# Patient Record
Sex: Female | Born: 1975 | Race: Black or African American | Hispanic: No | Marital: Married | Smoking: Never smoker
Health system: Southern US, Community
[De-identification: ages and names within clinical notes are randomized; demographics above are authoritative.]

## PROBLEM LIST (undated history)

## (undated) DIAGNOSIS — O139 Gestational [pregnancy-induced] hypertension without significant proteinuria, unspecified trimester: Secondary | ICD-10-CM

## (undated) DIAGNOSIS — Z8619 Personal history of other infectious and parasitic diseases: Secondary | ICD-10-CM

## (undated) DIAGNOSIS — D219 Benign neoplasm of connective and other soft tissue, unspecified: Secondary | ICD-10-CM

## (undated) DIAGNOSIS — O24419 Gestational diabetes mellitus in pregnancy, unspecified control: Secondary | ICD-10-CM

## (undated) HISTORY — PX: DILATION AND EVACUATION: SHX1459

## (undated) HISTORY — DX: Personal history of other infectious and parasitic diseases: Z86.19

## (undated) HISTORY — PX: DILATION AND CURETTAGE OF UTERUS: SHX78

---

## 2004-10-15 ENCOUNTER — Emergency Department (HOSPITAL_COMMUNITY): Admission: EM | Admit: 2004-10-15 | Discharge: 2004-10-15 | Payer: Self-pay | Admitting: Emergency Medicine

## 2004-10-17 ENCOUNTER — Inpatient Hospital Stay (HOSPITAL_COMMUNITY): Admission: AD | Admit: 2004-10-17 | Discharge: 2004-10-17 | Payer: Self-pay | Admitting: *Deleted

## 2004-10-23 ENCOUNTER — Inpatient Hospital Stay (HOSPITAL_COMMUNITY): Admission: AD | Admit: 2004-10-23 | Discharge: 2004-10-23 | Payer: Self-pay | Admitting: Obstetrics

## 2005-03-14 ENCOUNTER — Inpatient Hospital Stay (HOSPITAL_COMMUNITY): Admission: AD | Admit: 2005-03-14 | Discharge: 2005-03-14 | Payer: Self-pay | Admitting: Obstetrics

## 2005-03-15 ENCOUNTER — Inpatient Hospital Stay (HOSPITAL_COMMUNITY): Admission: AD | Admit: 2005-03-15 | Discharge: 2005-03-15 | Payer: Self-pay | Admitting: Obstetrics

## 2005-04-19 ENCOUNTER — Inpatient Hospital Stay (HOSPITAL_COMMUNITY): Admission: AD | Admit: 2005-04-19 | Discharge: 2005-04-22 | Payer: Self-pay | Admitting: Obstetrics & Gynecology

## 2005-07-31 ENCOUNTER — Ambulatory Visit (HOSPITAL_COMMUNITY): Admission: RE | Admit: 2005-07-31 | Discharge: 2005-07-31 | Payer: Self-pay | Admitting: Obstetrics & Gynecology

## 2006-01-16 ENCOUNTER — Emergency Department (HOSPITAL_COMMUNITY): Admission: EM | Admit: 2006-01-16 | Discharge: 2006-01-16 | Payer: Self-pay | Admitting: Family Medicine

## 2006-01-20 ENCOUNTER — Emergency Department (HOSPITAL_COMMUNITY): Admission: EM | Admit: 2006-01-20 | Discharge: 2006-01-20 | Payer: Self-pay | Admitting: Emergency Medicine

## 2008-05-26 ENCOUNTER — Emergency Department (HOSPITAL_COMMUNITY): Admission: EM | Admit: 2008-05-26 | Discharge: 2008-05-26 | Payer: Self-pay | Admitting: Emergency Medicine

## 2009-01-18 ENCOUNTER — Inpatient Hospital Stay (HOSPITAL_COMMUNITY): Admission: AD | Admit: 2009-01-18 | Discharge: 2009-01-18 | Payer: Self-pay | Admitting: Obstetrics & Gynecology

## 2010-03-15 ENCOUNTER — Ambulatory Visit (HOSPITAL_COMMUNITY): Admission: RE | Admit: 2010-03-15 | Discharge: 2010-03-15 | Payer: Self-pay | Admitting: Obstetrics and Gynecology

## 2010-10-29 ENCOUNTER — Inpatient Hospital Stay (HOSPITAL_COMMUNITY)
Admission: AD | Admit: 2010-10-29 | Discharge: 2010-10-29 | Payer: Self-pay | Source: Home / Self Care | Attending: Obstetrics & Gynecology | Admitting: Obstetrics & Gynecology

## 2010-11-18 NOTE — L&D Delivery Note (Signed)
Delivery Note At 12:48 AM a viable female was delivered via Vaginal, Spontaneous Delivery (Presentation: Left Occiput Anterior).  APGAR: 8, 9; weight 6 lb 2.9 oz (2805 g).   Placenta status: Intact, Spontaneous.  Cord: 3 vessels with the following complications: None.   Anesthesia: Local  Episiotomy:  Lacerations: 2nd degree Suture Repair: 3.0 vicryl Est. Blood Loss (mL): 350  Mom to postpartum.  Baby to nursery-stable.  Yakub Lodes H. 06/25/2011, 2:06 AM

## 2010-12-09 ENCOUNTER — Encounter: Payer: Self-pay | Admitting: Obstetrics & Gynecology

## 2011-01-28 LAB — URINALYSIS, ROUTINE W REFLEX MICROSCOPIC
Bilirubin Urine: NEGATIVE
Glucose, UA: NEGATIVE mg/dL
Hgb urine dipstick: NEGATIVE
Ketones, ur: NEGATIVE mg/dL
Nitrite: NEGATIVE
Protein, ur: NEGATIVE mg/dL
Specific Gravity, Urine: 1.02 (ref 1.005–1.030)
Urobilinogen, UA: 0.2 mg/dL (ref 0.0–1.0)
pH: 5.5 (ref 5.0–8.0)

## 2011-01-28 LAB — POCT PREGNANCY, URINE: Preg Test, Ur: POSITIVE

## 2011-05-01 ENCOUNTER — Encounter: Payer: 59 | Attending: Obstetrics and Gynecology

## 2011-05-01 DIAGNOSIS — O99810 Abnormal glucose complicating pregnancy: Secondary | ICD-10-CM | POA: Insufficient documentation

## 2011-05-01 DIAGNOSIS — Z713 Dietary counseling and surveillance: Secondary | ICD-10-CM | POA: Insufficient documentation

## 2011-06-25 ENCOUNTER — Inpatient Hospital Stay (HOSPITAL_COMMUNITY)
Admission: AD | Admit: 2011-06-25 | Discharge: 2011-06-26 | DRG: 774 | Disposition: A | Discharge status: Home / Self Care | Payer: 59 | Source: Ambulatory Visit | Attending: Obstetrics and Gynecology | Admitting: Obstetrics and Gynecology

## 2011-06-25 ENCOUNTER — Encounter (HOSPITAL_COMMUNITY): Payer: Self-pay | Admitting: *Deleted

## 2011-06-25 DIAGNOSIS — E119 Type 2 diabetes mellitus without complications: Secondary | ICD-10-CM | POA: Diagnosis present

## 2011-06-25 DIAGNOSIS — O2432 Unspecified pre-existing diabetes mellitus in childbirth: Secondary | ICD-10-CM | POA: Diagnosis present

## 2011-06-25 HISTORY — DX: Gestational (pregnancy-induced) hypertension without significant proteinuria, unspecified trimester: O13.9

## 2011-06-25 HISTORY — DX: Gestational diabetes mellitus in pregnancy, unspecified control: O24.419

## 2011-06-25 HISTORY — DX: Benign neoplasm of connective and other soft tissue, unspecified: D21.9

## 2011-06-25 LAB — ABO/RH: RH Type: POSITIVE

## 2011-06-25 LAB — RPR: RPR: NONREACTIVE

## 2011-06-25 LAB — HIV ANTIBODY (ROUTINE TESTING W REFLEX): HIV: NONREACTIVE

## 2011-06-25 LAB — CBC
HCT: 33.4 % — ABNORMAL LOW (ref 36.0–46.0)
Hemoglobin: 10.9 g/dL — ABNORMAL LOW (ref 12.0–15.0)
MCHC: 32.6 g/dL (ref 30.0–36.0)
RBC: 4.51 MIL/uL (ref 3.87–5.11)

## 2011-06-25 LAB — RUBELLA ANTIBODY, IGM: Rubella: IMMUNE

## 2011-06-25 LAB — TYPE AND SCREEN: Antibody Screen: NEGATIVE

## 2011-06-25 MED ORDER — SENNOSIDES-DOCUSATE SODIUM 8.6-50 MG PO TABS
2.0000 | ORAL_TABLET | Freq: Every day | ORAL | Status: DC
  Administered 2011-06-25: 1 via ORAL

## 2011-06-25 MED ORDER — OXYCODONE-ACETAMINOPHEN 5-325 MG PO TABS: 2.0000 | ORAL_TABLET | ORAL | Status: DC | PRN

## 2011-06-25 MED ORDER — DIBUCAINE 1 % RE OINT: 1.0000 "application " | TOPICAL_OINTMENT | RECTAL | Status: DC | PRN

## 2011-06-25 MED ORDER — DIPHENHYDRAMINE HCL 25 MG PO CAPS: 25.0000 mg | ORAL_CAPSULE | Freq: Four times a day (QID) | ORAL | Status: DC | PRN

## 2011-06-25 MED ORDER — CITRIC ACID-SODIUM CITRATE 334-500 MG/5ML PO SOLN: 30.0000 mL | ORAL | Status: DC | PRN

## 2011-06-25 MED ORDER — PRENATAL PLUS 27-1 MG PO TABS
1.0000 | ORAL_TABLET | Freq: Every day | ORAL | Status: DC
  Administered 2011-06-25 – 2011-06-26 (×2): 1 via ORAL
  Filled 2011-06-25: qty 1

## 2011-06-25 MED ORDER — BENZOCAINE-MENTHOL 20-0.5 % EX AERO: 1.0000 "application " | INHALATION_SPRAY | CUTANEOUS | Status: DC | PRN

## 2011-06-25 MED ORDER — SIMETHICONE 80 MG PO CHEW: 80.0000 mg | CHEWABLE_TABLET | ORAL | Status: DC | PRN

## 2011-06-25 MED ORDER — LIDOCAINE HCL (PF) 1 % IJ SOLN
30.0000 mL | INTRAMUSCULAR | Status: DC | PRN
  Filled 2011-06-25: qty 30

## 2011-06-25 MED ORDER — ZOLPIDEM TARTRATE 5 MG PO TABS: 5.0000 mg | ORAL_TABLET | Freq: Every evening | ORAL | Status: DC | PRN

## 2011-06-25 MED ORDER — ONDANSETRON HCL 4 MG/2ML IJ SOLN: 4.0000 mg | INTRAMUSCULAR | Status: DC | PRN

## 2011-06-25 MED ORDER — NALBUPHINE SYRINGE 5 MG/0.5 ML
10.0000 mg | INJECTION | INTRAMUSCULAR | Status: DC | PRN
  Filled 2011-06-25: qty 1

## 2011-06-25 MED ORDER — IBUPROFEN 600 MG PO TABS
600.0000 mg | ORAL_TABLET | Freq: Four times a day (QID) | ORAL | Status: DC
  Administered 2011-06-25 – 2011-06-26 (×6): 600 mg via ORAL
  Filled 2011-06-25 (×6): qty 1

## 2011-06-25 MED ORDER — ONDANSETRON HCL 4 MG/2ML IJ SOLN: 4.0000 mg | Freq: Four times a day (QID) | INTRAMUSCULAR | Status: DC | PRN

## 2011-06-25 MED ORDER — OXYTOCIN 10 UNIT/ML IJ SOLN
INTRAMUSCULAR | Status: AC
  Administered 2011-06-25: 20 [IU]
  Filled 2011-06-25: qty 2

## 2011-06-25 MED ORDER — TETANUS-DIPHTH-ACELL PERTUSSIS 5-2.5-18.5 LF-MCG/0.5 IM SUSP
0.5000 mL | Freq: Once | INTRAMUSCULAR | Status: DC
  Filled 2011-06-25: qty 0.5

## 2011-06-25 MED ORDER — FLEET ENEMA 7-19 GM/118ML RE ENEM: 1.0000 | ENEMA | RECTAL | Status: DC | PRN

## 2011-06-25 MED ORDER — ONDANSETRON HCL 4 MG PO TABS: 4.0000 mg | ORAL_TABLET | ORAL | Status: DC | PRN

## 2011-06-25 MED ORDER — ACETAMINOPHEN 325 MG PO TABS: 650.0000 mg | ORAL_TABLET | ORAL | Status: DC | PRN

## 2011-06-25 MED ORDER — WITCH HAZEL-GLYCERIN EX PADS: 1.0000 "application " | MEDICATED_PAD | CUTANEOUS | Status: DC | PRN

## 2011-06-25 MED ORDER — IBUPROFEN 600 MG PO TABS: 600.0000 mg | ORAL_TABLET | Freq: Four times a day (QID) | ORAL | Status: DC | PRN

## 2011-06-25 MED ORDER — LIDOCAINE HCL (PF) 1 % IJ SOLN
INTRAMUSCULAR | Status: AC
  Administered 2011-06-25: 01:00:00 via VAGINAL
  Filled 2011-06-25: qty 30

## 2011-06-25 MED ORDER — LACTATED RINGERS IV SOLN: INTRAVENOUS | Status: DC

## 2011-06-25 MED ORDER — LANOLIN HYDROUS EX OINT: TOPICAL_OINTMENT | CUTANEOUS | Status: DC | PRN

## 2011-06-25 MED ORDER — OXYCODONE-ACETAMINOPHEN 5-325 MG PO TABS
1.0000 | ORAL_TABLET | ORAL | Status: DC | PRN
  Administered 2011-06-25 (×2): 1 via ORAL
  Filled 2011-06-25 (×2): qty 1

## 2011-06-25 NOTE — ED Notes (Signed)
Pt arrived in MAU very uncomfortable.  Directly to room 7.  SVE 8/100/0, FHR 140's.  Pt to room 164 via stretcher.

## 2011-06-25 NOTE — Progress Notes (Signed)
PPD#0 Pt without c/o. Lochia-wnl. IMP/stable PLAN/ routine care.

## 2011-06-25 NOTE — H&P (Signed)
  35 yo G3P1011 presents to MAU with painful contractions and was found to be 8 cm dilated. Pt was quickly transferred to L&D. Patients prenatal course has been complicated by A1DM with excellent glucose control. Please see hollister for prenatal course.  Physical Exam Filed Vitals:   06/25/11 0146  BP: 137/63  Pulse: 81  Temp:   Resp: 18   Gen: AOx3, NAD Abd: Gravid, soft NT Cvx: C/C/0 FHT: 130s Toco: q2-3  A/P 35 yo G3P1011 @ 39+3 in active labor 1) Admit 2) AROM 3) Anticipate svd

## 2011-06-25 NOTE — Progress Notes (Signed)
Breastfeeding brochure reviewed with mom. Advised of community resources for breastfeeding mothers and outpatient services available. Mom has been breast and bottle feeding. Advised to do breast only unless supplementing is medically necessary. Discussed nipple confusion, supply and demand. If mom continues to supplement If mom continues to supplement use medicine dropper and limit supplements to 5-7 ml for now. Demonstrated hand expression and given a manual pump. Advised mom she could use colostrum as supplement is she feels this is necessary.

## 2011-06-26 ENCOUNTER — Encounter (HOSPITAL_COMMUNITY): Payer: 59

## 2011-06-26 NOTE — Progress Notes (Signed)
Post Partum Day 1 Subjective: no complaints  Objective: Blood pressure 119/80, pulse 78, temperature 98.4 F (36.9 C), temperature source Oral, resp. rate 18, height 5\' 4"  (1.626 m), weight 85.276 kg (188 lb), unknown if currently breastfeeding.  Physical Exam:  General: alert Lochia: appropriate Uterine Fundus: firm   Basename 06/25/11 0245  HGB 10.9*  HCT 33.4*    Assessment/Plan: Plan for discharge later today if baby can go   LOS: 1 day   Armonie Staten D 06/26/2011, 11:05 AM

## 2011-06-26 NOTE — Discharge Summary (Signed)
  Obstetric Discharge Summary Reason for Admission: onset of labor Prenatal Procedures: ultrasound Intrapartum Procedures: spontaneous vaginal delivery Postpartum Procedures: none Complications-Operative and Postpartum: 2nd degree perineal laceration Hemoglobin  Date Value Range Status  06/25/2011 10.9* 12.0-15.0 (g/dL) Final     HCT  Date Value Range Status  06/25/2011 33.4* 36.0-46.0 (%) Final    Discharge Diagnoses: Term Pregnancy-delivered  Discharge Information: Date: 06/26/2011 Activity: pelvic rest Diet: routine Medications: PNV and Ibuprophen Condition: stable Instructions: refer to practice specific booklet Discharge to: home Follow-up Information    Follow up with ROSS,KENDRA H. in 5 weeks.   Contact information:   [address] [place] [address] [phone]          Newborn Data: Live born female  Birth Weight: 6 lb 2.9 oz (2805 g) APGAR: 8, 9  Home with mother.  Ceasar Decandia D 06/26/2011, 12:28 PM

## 2011-07-05 NOTE — Progress Notes (Signed)
Encounter addended by: Caffie Pinto, RN on: 07/05/2011  4:59 PM<BR>     Documentation filed: Sheral Apley and Lactation Status

## 2012-09-11 LAB — OB RESULTS CONSOLE HIV ANTIBODY (ROUTINE TESTING): HIV: NONREACTIVE

## 2012-11-18 NOTE — L&D Delivery Note (Addendum)
Delivery Note At 1:44 PM a viable and healthy female was delivered via  (Presentation: OP manually rotated to LOA ).  APGAR: 9, 9; weight 7 lbs 13 oz.   Placenta status: , .  Cord:  3 vessels.  Anesthesia: Epidural  Lacerations: 1st degree perineal laceration Suture Repair: 3.0 chromic Est. Blood Loss (mL): 300  Mom to postpartum.  Baby to nursery-stable.  Darnisha Vernet D 04/20/2013, 2:11 PM

## 2013-01-07 ENCOUNTER — Other Ambulatory Visit: Payer: Self-pay | Admitting: Obstetrics and Gynecology

## 2013-01-12 ENCOUNTER — Other Ambulatory Visit: Payer: Self-pay | Admitting: Obstetrics and Gynecology

## 2013-01-12 ENCOUNTER — Ambulatory Visit
Admission: RE | Admit: 2013-01-12 | Discharge: 2013-01-12 | Disposition: A | Discharge status: Home / Self Care | Payer: 59 | Source: Ambulatory Visit | Attending: Obstetrics and Gynecology | Admitting: Obstetrics and Gynecology

## 2013-01-12 DIAGNOSIS — R2231 Localized swelling, mass and lump, right upper limb: Secondary | ICD-10-CM

## 2013-02-17 ENCOUNTER — Encounter: Payer: 59 | Attending: Obstetrics & Gynecology | Admitting: *Deleted

## 2013-02-17 ENCOUNTER — Encounter: Payer: Self-pay | Admitting: *Deleted

## 2013-02-17 DIAGNOSIS — Z713 Dietary counseling and surveillance: Secondary | ICD-10-CM | POA: Insufficient documentation

## 2013-02-17 DIAGNOSIS — O99810 Abnormal glucose complicating pregnancy: Secondary | ICD-10-CM | POA: Insufficient documentation

## 2013-02-17 NOTE — Progress Notes (Signed)
  Patient was seen on 02/17/2013 for Gestational Diabetes self-management class at the [facility]. The following learning objectives were met by the patient during this course:   States the definition of Gestational Diabetes  States why dietary management is important in controlling blood glucose  Describes the effects each nutrient has on blood glucose levels  Demonstrates ability to create a balanced meal plan  Demonstrates carbohydrate counting   States when to check blood glucose levels  Demonstrates proper blood glucose monitoring techniques  States the effect of stress and exercise on blood glucose levels  States the importance of limiting caffeine and abstaining from alcohol and smoking  Blood glucose monitor given: Accu Chek Nano BG Monitoring Kit Lot # [id number] Exp: 03/17/2014 Blood glucose reading: 63 mg/dl  Patient instructed to monitor glucose levels: FBS: 60 - <90 2 hour: <120  *Patient received handouts:  Nutrition Diabetes and Pregnancy  Carbohydrate Counting List  Patient will be seen for follow-up as needed.

## 2013-04-20 ENCOUNTER — Encounter (HOSPITAL_COMMUNITY): Payer: Self-pay | Admitting: *Deleted

## 2013-04-20 ENCOUNTER — Encounter (HOSPITAL_COMMUNITY): Payer: Self-pay | Admitting: Anesthesiology

## 2013-04-20 ENCOUNTER — Inpatient Hospital Stay (HOSPITAL_COMMUNITY): Payer: 59 | Admitting: Anesthesiology

## 2013-04-20 ENCOUNTER — Inpatient Hospital Stay (HOSPITAL_COMMUNITY)
Admission: AD | Admit: 2013-04-20 | Discharge: 2013-04-21 | DRG: 775 | Disposition: A | Discharge status: Home / Self Care | Payer: 59 | Source: Ambulatory Visit | Attending: Obstetrics and Gynecology | Admitting: Obstetrics and Gynecology

## 2013-04-20 DIAGNOSIS — O09529 Supervision of elderly multigravida, unspecified trimester: Secondary | ICD-10-CM | POA: Diagnosis present

## 2013-04-20 DIAGNOSIS — O99814 Abnormal glucose complicating childbirth: Principal | ICD-10-CM | POA: Diagnosis present

## 2013-04-20 LAB — CBC
MCH: 22.3 pg — ABNORMAL LOW (ref 26.0–34.0)
MCHC: 31.6 g/dL (ref 30.0–36.0)
Platelets: 147 10*3/uL — ABNORMAL LOW (ref 150–400)
RDW: 19.2 % — ABNORMAL HIGH (ref 11.5–15.5)

## 2013-04-20 LAB — RPR: RPR Ser Ql: NONREACTIVE

## 2013-04-20 LAB — TYPE AND SCREEN: Antibody Screen: NEGATIVE

## 2013-04-20 MED ORDER — OXYTOCIN 40 UNITS IN LACTATED RINGERS INFUSION - SIMPLE MED
1.0000 m[IU]/min | INTRAVENOUS | Status: DC
  Administered 2013-04-20: 2 m[IU]/min via INTRAVENOUS

## 2013-04-20 MED ORDER — OXYCODONE-ACETAMINOPHEN 5-325 MG PO TABS
1.0000 | ORAL_TABLET | ORAL | Status: DC | PRN
  Administered 2013-04-20 (×2): 1 via ORAL
  Filled 2013-04-20 (×2): qty 1

## 2013-04-20 MED ORDER — CITRIC ACID-SODIUM CITRATE 334-500 MG/5ML PO SOLN: 30.0000 mL | ORAL | Status: DC | PRN

## 2013-04-20 MED ORDER — ACETAMINOPHEN 325 MG PO TABS: 650.0000 mg | ORAL_TABLET | ORAL | Status: DC | PRN

## 2013-04-20 MED ORDER — OXYTOCIN 40 UNITS IN LACTATED RINGERS INFUSION - SIMPLE MED
62.5000 mL/h | INTRAVENOUS | Status: DC
  Filled 2013-04-20: qty 1000

## 2013-04-20 MED ORDER — IBUPROFEN 600 MG PO TABS
600.0000 mg | ORAL_TABLET | Freq: Four times a day (QID) | ORAL | Status: DC
  Administered 2013-04-20 – 2013-04-21 (×4): 600 mg via ORAL
  Filled 2013-04-20 (×5): qty 1

## 2013-04-20 MED ORDER — LACTATED RINGERS IV SOLN
500.0000 mL | INTRAVENOUS | Status: DC | PRN
Start: 2013-04-20 — End: 2013-04-20
  Administered 2013-04-20: 500 mL via INTRAVENOUS

## 2013-04-20 MED ORDER — FLEET ENEMA 7-19 GM/118ML RE ENEM: 1.0000 | ENEMA | RECTAL | Status: DC | PRN

## 2013-04-20 MED ORDER — TERBUTALINE SULFATE 1 MG/ML IJ SOLN: 0.2500 mg | Freq: Once | INTRAMUSCULAR | Status: DC | PRN

## 2013-04-20 MED ORDER — PRENATAL MULTIVITAMIN CH
1.0000 | ORAL_TABLET | Freq: Every day | ORAL | Status: DC
  Administered 2013-04-21: 1 via ORAL
  Filled 2013-04-20: qty 1

## 2013-04-20 MED ORDER — DIBUCAINE 1 % RE OINT: 1.0000 "application " | TOPICAL_OINTMENT | RECTAL | Status: DC | PRN

## 2013-04-20 MED ORDER — IBUPROFEN 600 MG PO TABS: 600.0000 mg | ORAL_TABLET | Freq: Four times a day (QID) | ORAL | Status: DC | PRN

## 2013-04-20 MED ORDER — SIMETHICONE 80 MG PO CHEW: 80.0000 mg | CHEWABLE_TABLET | ORAL | Status: DC | PRN

## 2013-04-20 MED ORDER — BUTORPHANOL TARTRATE 1 MG/ML IJ SOLN: 1.0000 mg | INTRAMUSCULAR | Status: DC | PRN

## 2013-04-20 MED ORDER — LACTATED RINGERS IV SOLN: 500.0000 mL | Freq: Once | INTRAVENOUS | Status: DC

## 2013-04-20 MED ORDER — LANOLIN HYDROUS EX OINT: TOPICAL_OINTMENT | CUTANEOUS | Status: DC | PRN

## 2013-04-20 MED ORDER — EPHEDRINE 5 MG/ML INJ
10.0000 mg | INTRAVENOUS | Status: DC | PRN
  Filled 2013-04-20: qty 2

## 2013-04-20 MED ORDER — TETANUS-DIPHTH-ACELL PERTUSSIS 5-2.5-18.5 LF-MCG/0.5 IM SUSP: 0.5000 mL | Freq: Once | INTRAMUSCULAR | Status: DC

## 2013-04-20 MED ORDER — ONDANSETRON HCL 4 MG/2ML IJ SOLN: 4.0000 mg | INTRAMUSCULAR | Status: DC | PRN

## 2013-04-20 MED ORDER — OXYCODONE-ACETAMINOPHEN 5-325 MG PO TABS: 1.0000 | ORAL_TABLET | ORAL | Status: DC | PRN

## 2013-04-20 MED ORDER — BENZOCAINE-MENTHOL 20-0.5 % EX AERO
1.0000 "application " | INHALATION_SPRAY | CUTANEOUS | Status: DC | PRN
  Administered 2013-04-21: 1 via TOPICAL
  Filled 2013-04-20 (×2): qty 56

## 2013-04-20 MED ORDER — PHENYLEPHRINE 40 MCG/ML (10ML) SYRINGE FOR IV PUSH (FOR BLOOD PRESSURE SUPPORT)
80.0000 ug | PREFILLED_SYRINGE | INTRAVENOUS | Status: DC | PRN
  Filled 2013-04-20: qty 2

## 2013-04-20 MED ORDER — ONDANSETRON HCL 4 MG/2ML IJ SOLN: 4.0000 mg | Freq: Four times a day (QID) | INTRAMUSCULAR | Status: DC | PRN

## 2013-04-20 MED ORDER — FENTANYL 2.5 MCG/ML BUPIVACAINE 1/10 % EPIDURAL INFUSION (WH - ANES)
14.0000 mL/h | INTRAMUSCULAR | Status: DC | PRN
  Administered 2013-04-20: 14 mL/h via EPIDURAL
  Filled 2013-04-20: qty 125

## 2013-04-20 MED ORDER — DIPHENHYDRAMINE HCL 50 MG/ML IJ SOLN: 12.5000 mg | INTRAMUSCULAR | Status: DC | PRN

## 2013-04-20 MED ORDER — OXYTOCIN BOLUS FROM INFUSION
500.0000 mL | INTRAVENOUS | Status: DC
  Administered 2013-04-20: 500 mL via INTRAVENOUS

## 2013-04-20 MED ORDER — LACTATED RINGERS IV SOLN
INTRAVENOUS | Status: DC
  Administered 2013-04-20: 09:00:00 via INTRAVENOUS
  Administered 2013-04-20: 125 mL/h via INTRAVENOUS

## 2013-04-20 MED ORDER — WITCH HAZEL-GLYCERIN EX PADS: 1.0000 "application " | MEDICATED_PAD | CUTANEOUS | Status: DC | PRN

## 2013-04-20 MED ORDER — PHENYLEPHRINE 40 MCG/ML (10ML) SYRINGE FOR IV PUSH (FOR BLOOD PRESSURE SUPPORT)
80.0000 ug | PREFILLED_SYRINGE | INTRAVENOUS | Status: DC | PRN
  Filled 2013-04-20: qty 2
  Filled 2013-04-20: qty 5

## 2013-04-20 MED ORDER — ONDANSETRON HCL 4 MG PO TABS: 4.0000 mg | ORAL_TABLET | ORAL | Status: DC | PRN

## 2013-04-20 MED ORDER — LIDOCAINE HCL (PF) 1 % IJ SOLN
INTRAMUSCULAR | Status: DC | PRN
  Administered 2013-04-20 (×2): 5 mL

## 2013-04-20 MED ORDER — ZOLPIDEM TARTRATE 5 MG PO TABS: 5.0000 mg | ORAL_TABLET | Freq: Every evening | ORAL | Status: DC | PRN

## 2013-04-20 MED ORDER — SENNOSIDES-DOCUSATE SODIUM 8.6-50 MG PO TABS
2.0000 | ORAL_TABLET | Freq: Every day | ORAL | Status: DC
  Administered 2013-04-20: 2 via ORAL

## 2013-04-20 MED ORDER — LIDOCAINE HCL (PF) 1 % IJ SOLN
30.0000 mL | INTRAMUSCULAR | Status: DC | PRN
  Filled 2013-04-20: qty 30

## 2013-04-20 MED ORDER — DIPHENHYDRAMINE HCL 25 MG PO CAPS: 25.0000 mg | ORAL_CAPSULE | Freq: Four times a day (QID) | ORAL | Status: DC | PRN

## 2013-04-20 MED ORDER — EPHEDRINE 5 MG/ML INJ
10.0000 mg | INTRAVENOUS | Status: DC | PRN
  Filled 2013-04-20: qty 2
  Filled 2013-04-20: qty 4

## 2013-04-20 NOTE — Progress Notes (Signed)
AROM clear

## 2013-04-20 NOTE — Progress Notes (Signed)
Dr. Arlyce Dice in room.  Performed vaginal exam.  Pt. Tolerated well.

## 2013-04-20 NOTE — Anesthesia Preprocedure Evaluation (Signed)
Anesthesia Evaluation  Patient identified by MRN, date of birth, ID band Patient awake    Reviewed: Allergy & Precautions, H&P , Patient's Chart, lab work & pertinent test results  Airway Mallampati: III TM Distance: >3 FB Neck ROM: full    Dental no notable dental hx.    Pulmonary neg pulmonary ROS,  breath sounds clear to auscultation  Pulmonary exam normal       Cardiovascular negative cardio ROS  Rhythm:regular Rate:Normal     Neuro/Psych negative neurological ROS  negative psych ROS   GI/Hepatic negative GI ROS, Neg liver ROS,   Endo/Other  negative endocrine ROSdiabetesMorbid obesity  Renal/GU negative Renal ROS     Musculoskeletal   Abdominal   Peds  Hematology negative hematology ROS (+)   Anesthesia Other Findings Fibroid     Pregnancy induced hypertension   with first pregnancy    Gestational diabetes   with second and third child      Reproductive/Obstetrics (+) Pregnancy                           Anesthesia Physical Anesthesia Plan  ASA: III  Anesthesia Plan: Epidural   Post-op Pain Management:    Induction:   Airway Management Planned:   Additional Equipment:   Intra-op Plan:   Post-operative Plan:   Informed Consent: I have reviewed the patients History and Physical, chart, labs and discussed the procedure including the risks, benefits and alternatives for the proposed anesthesia with the patient or authorized representative who has indicated his/her understanding and acceptance.     Plan Discussed with:   Anesthesia Plan Comments:         Anesthesia Quick Evaluation

## 2013-04-20 NOTE — Anesthesia Procedure Notes (Signed)
Epidural Patient location during procedure: OB Start time: 04/20/2013 11:44 AM  Staffing Anesthesiologist: Angus Seller., Harrell Gave. Performed by: anesthesiologist   Preanesthetic Checklist Completed: patient identified, site marked, surgical consent, pre-op evaluation, timeout performed, IV checked, risks and benefits discussed and monitors and equipment checked  Epidural Patient position: sitting Prep: site prepped and draped and DuraPrep Patient monitoring: continuous pulse ox and blood pressure Approach: midline Injection technique: LOR air and LOR saline  Needle:  Needle type: Tuohy  Needle gauge: 17 G Needle length: 9 cm and 9 Needle insertion depth: 7 cm Catheter type: closed end flexible Catheter size: 19 Gauge Catheter at skin depth: 13 cm Test dose: negative  Assessment Events: blood not aspirated, injection not painful, no injection resistance, negative IV test and no paresthesia  Additional Notes Patient identified.  Risk benefits discussed including failed block, incomplete pain control, headache, nerve damage, paralysis, blood pressure changes, nausea, vomiting, reactions to medication both toxic or allergic, and postpartum back pain.  Patient expressed understanding and wished to proceed.  All questions were answered.  Sterile technique used throughout procedure and epidural site dressed with sterile barrier dressing. No paresthesia or other complications noted.The patient did not experience any signs of intravascular injection such as tinnitus or metallic taste in mouth nor signs of intrathecal spread such as rapid motor block. Please see nursing notes for vital signs.

## 2013-04-20 NOTE — H&P (Signed)
37 y.o. [id number]  Estimated Date of Delivery: 04/20/13 admitted at [redacted] weeks gestation for induction.  Prenatal Transfer Tool  Maternal Diabetes: Yes:  Diabetes Type:  Diet controlled Genetic Screening: Normal Maternal Ultrasounds/Referrals: Normal Fetal Ultrasounds or other Referrals:  None Maternal Substance Abuse:  No Significant Maternal Medications:  None Significant Maternal Lab Results: None Other Significant Pregnancy Complications:  None  Afebrile, VSS Heart and Lungs: No active disease Abdomen: soft, gravid, EFW AGA. Cervical exam:  4/70  Impression: Well controlled GDM at term with favorable cervix  Plan:  Pitocin induction.

## 2013-04-21 LAB — CBC
MCH: 22.6 pg — ABNORMAL LOW (ref 26.0–34.0)
MCHC: 31.9 g/dL (ref 30.0–36.0)
Platelets: 151 10*3/uL (ref 150–400)
RBC: 3.93 MIL/uL (ref 3.87–5.11)
RDW: 19.2 % — ABNORMAL HIGH (ref 11.5–15.5)

## 2013-04-21 NOTE — Anesthesia Postprocedure Evaluation (Signed)
  Anesthesia Post-op Note  Patient: Catherine Maldonado  Procedure(s) Performed: * No procedures listed *  Patient Location: Mother/Baby  Anesthesia Type:Epidural  Level of Consciousness: awake, alert , oriented and patient cooperative  Airway and Oxygen Therapy: Patient Spontanous Breathing  Post-op Pain: mild  Post-op Assessment: Patient's Cardiovascular Status Stable and Respiratory Function Stable  Post-op Vital Signs: stable  Complications: No apparent anesthesia complications

## 2013-04-21 NOTE — Discharge Summary (Signed)
Obstetric Discharge Summary Reason for Admission: induction of labor Prenatal Procedures: none Intrapartum Procedures: spontaneous vaginal delivery Postpartum Procedures: none Complications-Operative and Postpartum: 1st degree perineal laceration Hemoglobin  Date Value Range Status  04/21/2013 8.9* 12.0 - 15.0 g/dL Final     HCT  Date Value Range Status  04/21/2013 27.9* 36.0 - 46.0 % Final    Physical Exam:  General: alert and cooperative Lochia: appropriate Uterine Fundus: firm DVT Evaluation: No evidence of DVT seen on physical exam.  Discharge Diagnoses: Term Pregnancy-delivered  Discharge Information: Date: 04/21/2013 Activity: pelvic rest Diet: routine Medications: PNV and Ibuprofen Condition: stable Instructions: refer to practice specific booklet Discharge to: home Follow-up Information   Follow up with Mickel Baas, MD In 4 weeks.   Contact information:   [address] [place] [address] [phone]       Newborn Data: Live born female  Birth Weight: 7 lb 13.4 oz (3555 g) APGAR: 9, 9  Home with mother.  Catherine Maldonado 04/21/2013, 10:02 AM

## 2013-10-21 ENCOUNTER — Emergency Department (HOSPITAL_COMMUNITY)
Admission: EM | Admit: 2013-10-21 | Discharge: 2013-10-21 | Disposition: A | Discharge status: Home / Self Care | Payer: 59 | Source: Home / Self Care

## 2013-10-21 ENCOUNTER — Encounter (HOSPITAL_COMMUNITY): Payer: Self-pay | Admitting: Emergency Medicine

## 2013-10-21 DIAGNOSIS — H6592 Unspecified nonsuppurative otitis media, left ear: Secondary | ICD-10-CM

## 2013-10-21 DIAGNOSIS — H6590 Unspecified nonsuppurative otitis media, unspecified ear: Secondary | ICD-10-CM

## 2013-10-21 DIAGNOSIS — L049 Acute lymphadenitis, unspecified: Secondary | ICD-10-CM

## 2013-10-21 DIAGNOSIS — B309 Viral conjunctivitis, unspecified: Secondary | ICD-10-CM

## 2013-10-21 MED ORDER — KETOTIFEN FUMARATE 0.025 % OP SOLN: 1.0000 [drp] | Freq: Two times a day (BID) | OPHTHALMIC | Status: DC

## 2013-10-21 MED ORDER — AMOXICILLIN 500 MG PO CAPS: 1000.0000 mg | ORAL_CAPSULE | Freq: Two times a day (BID) | ORAL | Status: DC

## 2013-10-21 NOTE — ED Provider Notes (Signed)
CSN: [id number]     Arrival date & time 10/21/13  1318 History   First MD Initiated Contact with Patient 10/21/13 1513     Chief Complaint  Patient presents with  . Facial Swelling   (Consider location/radiation/quality/duration/timing/severity/associated sxs/prior Treatment) HPI Comments: 37 y o F with red eyes, swelling L side of neck and sore throat yesterday.   Past Medical History  Diagnosis Date  . Fibroid   . Pregnancy induced hypertension     with first pregnancy  . Gestational diabetes     with second and third child   Past Surgical History  Procedure Laterality Date  . Dilation and evacuation     Family History  Problem Relation Age of Onset  . Diabetes Maternal Grandmother    History  Substance Use Topics  . Smoking status: Never Smoker   . Smokeless tobacco: Never Used  . Alcohol Use: No   OB History   Grav Para Term Preterm Abortions TAB SAB Ect Mult Living   5 3 3  0 2 0 1 0 0 3     Review of Systems  Constitutional: Negative.  Negative for fever.  HENT: Positive for congestion and ear pain. Negative for ear discharge.   Eyes: Positive for discharge, redness and itching.  Respiratory: Negative for cough, choking and wheezing.   Cardiovascular: Negative.   Gastrointestinal: Negative.   Genitourinary: Negative.   Musculoskeletal: Negative.   Skin: Negative for rash.    Allergies  Review of patient's allergies indicates no known allergies.  Home Medications   Current Outpatient Rx  Name  Route  Sig  Dispense  Refill  . Prenatal Vit-Fe Fumarate-FA (PRENATAL MULTIVITAMIN) TABS   Oral   Take 1 tablet by mouth daily at 12 noon.          BP 145/83  Pulse 78  Temp(Src) 98.6 F (37 C) (Oral)  Resp 16  SpO2 100%  LMP 10/02/2013 Physical Exam  Nursing note and vitals reviewed. Constitutional: She is oriented to person, place, and time. She appears well-developed and well-nourished. She appears distressed.  HENT:  Mouth/Throat: No  oropharyngeal exudate.  R TM retracted, pearly gray, no effusion. L TM with pale amber effusion. No erythema. OP with minor erythema and clear PND.  Eyes: EOM are normal. Pupils are equal, round, and reactive to light.  Bilateral conjunctival erythema, minor swelling, light scleral injection, scant clear, watery discharge.  Neck: Normal range of motion. Neck supple.  Solitary .75 cm tender L  preauricular lymph node.   Solitary 1 cm L anterior cervical tender node. This is the swelling the pt is referring to.  Cardiovascular: Normal rate, regular rhythm and normal heart sounds.   Pulmonary/Chest: Effort normal and breath sounds normal. No respiratory distress. She has no wheezes. She has no rales.  Musculoskeletal: She exhibits no edema.  Neurological: She is alert and oriented to person, place, and time. She exhibits normal muscle tone.  Skin: Skin is warm and dry.  Psychiatric: She has a normal mood and affect.    ED Course  Procedures (including critical care time) Labs Review Labs Reviewed - No data to display Imaging Review No results found.    MDM   1. Middle ear effusion, left   2. Lymphadenitis, acute   3. Viral conjunctivitis      Amoxicillin 1 gm bid zaditor eye gtts    Hayden Rasmussen, NP 10/21/13 1538

## 2013-10-21 NOTE — ED Notes (Signed)
C/o bilateral red eye and left neck swelling States neck has been swollen for two days now Patient doesn't have PCP Neck swelling is making it difficulty to swallow and tender to touch

## 2013-10-22 NOTE — ED Provider Notes (Signed)
Medical screening examination/treatment/procedure(s) were performed by a resident physician or non-physician practitioner and as the supervising physician I was immediately available for consultation/collaboration.  Lovenia Debruler, MD    Madora Barletta S Khristin Keleher, MD 10/22/13 0808 

## 2014-09-19 ENCOUNTER — Encounter (HOSPITAL_COMMUNITY): Payer: Self-pay | Admitting: Emergency Medicine

## 2014-11-18 NOTE — L&D Delivery Note (Signed)
Patient was C/C/+2 and pushed for 3 minutes with epidural.   NSVD  female infant, Apgars 9,9, weight P.   The patient had a first degree midline perineal laceration repaired with 2-0 vicryl R. Fundus was firm. EBL was expected amount- 50 cc by my estimate. Placenta was delivered intact. Vagina was clear.  Baby was vigorous and doing skin to skin with mother.  [name] A

## 2015-01-05 LAB — OB RESULTS CONSOLE RPR: RPR: NONREACTIVE

## 2015-01-05 LAB — OB RESULTS CONSOLE ABO/RH: RH Type: POSITIVE

## 2015-01-05 LAB — OB RESULTS CONSOLE HEPATITIS B SURFACE ANTIGEN: HEP B S AG: NEGATIVE

## 2015-01-05 LAB — OB RESULTS CONSOLE RUBELLA ANTIBODY, IGM: RUBELLA: IMMUNE

## 2015-01-05 LAB — OB RESULTS CONSOLE GC/CHLAMYDIA
CHLAMYDIA, DNA PROBE: NEGATIVE
GC PROBE AMP, GENITAL: NEGATIVE

## 2015-01-05 LAB — OB RESULTS CONSOLE ANTIBODY SCREEN: Antibody Screen: NEGATIVE

## 2015-01-05 LAB — OB RESULTS CONSOLE HIV ANTIBODY (ROUTINE TESTING): HIV: NONREACTIVE

## 2015-06-30 LAB — OB RESULTS CONSOLE GBS: GBS: NEGATIVE

## 2015-07-17 ENCOUNTER — Telehealth (HOSPITAL_COMMUNITY): Payer: Self-pay | Admitting: *Deleted

## 2015-07-17 ENCOUNTER — Encounter (HOSPITAL_COMMUNITY): Payer: Self-pay | Admitting: *Deleted

## 2015-07-17 NOTE — Telephone Encounter (Signed)
Preadmission screen  

## 2015-07-18 ENCOUNTER — Telehealth (HOSPITAL_COMMUNITY): Payer: Self-pay | Admitting: *Deleted

## 2015-07-18 ENCOUNTER — Encounter (HOSPITAL_COMMUNITY): Payer: Self-pay | Admitting: *Deleted

## 2015-07-18 NOTE — Telephone Encounter (Signed)
Preadmission screen  

## 2015-07-20 ENCOUNTER — Encounter (HOSPITAL_COMMUNITY): Payer: Self-pay | Admitting: *Deleted

## 2015-07-20 ENCOUNTER — Inpatient Hospital Stay (HOSPITAL_COMMUNITY): Payer: BLUE CROSS/BLUE SHIELD | Admitting: Anesthesiology

## 2015-07-20 ENCOUNTER — Inpatient Hospital Stay (HOSPITAL_COMMUNITY)
Admission: AD | Admit: 2015-07-20 | Discharge: 2015-07-22 | DRG: 775 | Disposition: A | Discharge status: Home / Self Care | Payer: BLUE CROSS/BLUE SHIELD | Source: Ambulatory Visit | Attending: Obstetrics and Gynecology | Admitting: Obstetrics and Gynecology

## 2015-07-20 DIAGNOSIS — Z8632 Personal history of gestational diabetes: Secondary | ICD-10-CM | POA: Diagnosis not present

## 2015-07-20 DIAGNOSIS — Z3A39 39 weeks gestation of pregnancy: Secondary | ICD-10-CM | POA: Diagnosis present

## 2015-07-20 DIAGNOSIS — O09523 Supervision of elderly multigravida, third trimester: Secondary | ICD-10-CM

## 2015-07-20 DIAGNOSIS — O9989 Other specified diseases and conditions complicating pregnancy, childbirth and the puerperium: Secondary | ICD-10-CM | POA: Diagnosis present

## 2015-07-20 DIAGNOSIS — Z3490 Encounter for supervision of normal pregnancy, unspecified, unspecified trimester: Secondary | ICD-10-CM

## 2015-07-20 LAB — CBC
HEMATOCRIT: 36.6 % (ref 36.0–46.0)
Hemoglobin: 12.1 g/dL (ref 12.0–15.0)
MCH: 26.1 pg (ref 26.0–34.0)
MCHC: 33.1 g/dL (ref 30.0–36.0)
MCV: 79 fL (ref 78.0–100.0)
PLATELETS: 172 10*3/uL (ref 150–400)
RBC: 4.63 MIL/uL (ref 3.87–5.11)
RDW: 17 % — AB (ref 11.5–15.5)
WBC: 7.9 10*3/uL (ref 4.0–10.5)

## 2015-07-20 LAB — TYPE AND SCREEN
ABO/RH(D): B POS
Antibody Screen: NEGATIVE

## 2015-07-20 MED ORDER — OXYCODONE-ACETAMINOPHEN 5-325 MG PO TABS: 2.0000 | ORAL_TABLET | ORAL | Status: DC | PRN

## 2015-07-20 MED ORDER — ZOLPIDEM TARTRATE 5 MG PO TABS
5.0000 mg | ORAL_TABLET | Freq: Every evening | ORAL | Status: DC | PRN
Start: 2015-07-20 — End: 2015-07-22

## 2015-07-20 MED ORDER — ONDANSETRON HCL 4 MG PO TABS: 4.0000 mg | ORAL_TABLET | ORAL | Status: DC | PRN

## 2015-07-20 MED ORDER — CITRIC ACID-SODIUM CITRATE 334-500 MG/5ML PO SOLN: 30.0000 mL | ORAL | Status: DC | PRN

## 2015-07-20 MED ORDER — ONDANSETRON HCL 4 MG/2ML IJ SOLN: 4.0000 mg | Freq: Four times a day (QID) | INTRAMUSCULAR | Status: DC | PRN

## 2015-07-20 MED ORDER — SIMETHICONE 80 MG PO CHEW: 80.0000 mg | CHEWABLE_TABLET | ORAL | Status: DC | PRN

## 2015-07-20 MED ORDER — METHYLERGONOVINE MALEATE 0.2 MG PO TABS: 0.2000 mg | ORAL_TABLET | ORAL | Status: DC | PRN

## 2015-07-20 MED ORDER — DIPHENHYDRAMINE HCL 50 MG/ML IJ SOLN: 12.5000 mg | INTRAMUSCULAR | Status: DC | PRN

## 2015-07-20 MED ORDER — LACTATED RINGERS IV SOLN
INTRAVENOUS | Status: DC
  Administered 2015-07-20 (×2): via INTRAVENOUS

## 2015-07-20 MED ORDER — SODIUM CHLORIDE 0.9 % IJ SOLN: 3.0000 mL | INTRAMUSCULAR | Status: DC | PRN

## 2015-07-20 MED ORDER — FLEET ENEMA 7-19 GM/118ML RE ENEM: 1.0000 | ENEMA | RECTAL | Status: DC | PRN

## 2015-07-20 MED ORDER — SODIUM CHLORIDE 0.9 % IJ SOLN: 3.0000 mL | Freq: Two times a day (BID) | INTRAMUSCULAR | Status: DC

## 2015-07-20 MED ORDER — LANOLIN HYDROUS EX OINT: TOPICAL_OINTMENT | CUTANEOUS | Status: DC | PRN

## 2015-07-20 MED ORDER — OXYTOCIN 40 UNITS IN LACTATED RINGERS INFUSION - SIMPLE MED
1.0000 m[IU]/min | INTRAVENOUS | Status: DC
  Administered 2015-07-20: 666 m[IU]/min via INTRAVENOUS
  Administered 2015-07-20: 2 m[IU]/min via INTRAVENOUS

## 2015-07-20 MED ORDER — OXYTOCIN 40 UNITS IN LACTATED RINGERS INFUSION - SIMPLE MED
62.5000 mL/h | INTRAVENOUS | Status: DC
  Filled 2015-07-20: qty 1000

## 2015-07-20 MED ORDER — ONDANSETRON HCL 4 MG/2ML IJ SOLN: 4.0000 mg | INTRAMUSCULAR | Status: DC | PRN

## 2015-07-20 MED ORDER — ACETAMINOPHEN 325 MG PO TABS
650.0000 mg | ORAL_TABLET | ORAL | Status: DC | PRN
Start: 2015-07-20 — End: 2015-07-22

## 2015-07-20 MED ORDER — FENTANYL 2.5 MCG/ML BUPIVACAINE 1/10 % EPIDURAL INFUSION (WH - ANES)
14.0000 mL/h | INTRAMUSCULAR | Status: DC | PRN
  Administered 2015-07-20: 14 mL/h via EPIDURAL
  Filled 2015-07-20: qty 125

## 2015-07-20 MED ORDER — PHENYLEPHRINE 40 MCG/ML (10ML) SYRINGE FOR IV PUSH (FOR BLOOD PRESSURE SUPPORT)
80.0000 ug | PREFILLED_SYRINGE | INTRAVENOUS | Status: DC | PRN
  Filled 2015-07-20: qty 20
  Filled 2015-07-20: qty 2

## 2015-07-20 MED ORDER — LIDOCAINE HCL (PF) 1 % IJ SOLN
INTRAMUSCULAR | Status: DC | PRN
  Administered 2015-07-20: 3 mL via EPIDURAL
  Administered 2015-07-20: 5 mL via EPIDURAL
  Administered 2015-07-20: 2 mL via EPIDURAL

## 2015-07-20 MED ORDER — OXYTOCIN BOLUS FROM INFUSION: 500.0000 mL | INTRAVENOUS | Status: DC

## 2015-07-20 MED ORDER — MEASLES, MUMPS & RUBELLA VAC ~~LOC~~ INJ
0.5000 mL | INJECTION | Freq: Once | SUBCUTANEOUS | Status: DC
  Filled 2015-07-20: qty 0.5

## 2015-07-20 MED ORDER — LIDOCAINE HCL (PF) 1 % IJ SOLN
30.0000 mL | INTRAMUSCULAR | Status: DC | PRN
  Filled 2015-07-20: qty 30

## 2015-07-20 MED ORDER — SENNOSIDES-DOCUSATE SODIUM 8.6-50 MG PO TABS
2.0000 | ORAL_TABLET | ORAL | Status: DC
  Administered 2015-07-21 (×2): 2 via ORAL
  Filled 2015-07-20 (×2): qty 2

## 2015-07-20 MED ORDER — OXYCODONE-ACETAMINOPHEN 5-325 MG PO TABS: 1.0000 | ORAL_TABLET | ORAL | Status: DC | PRN

## 2015-07-20 MED ORDER — BENZOCAINE-MENTHOL 20-0.5 % EX AERO
1.0000 "application " | INHALATION_SPRAY | CUTANEOUS | Status: DC | PRN
  Administered 2015-07-20 – 2015-07-21 (×2): 1 via TOPICAL
  Filled 2015-07-20 (×2): qty 56

## 2015-07-20 MED ORDER — DIBUCAINE 1 % RE OINT: 1.0000 "application " | TOPICAL_OINTMENT | RECTAL | Status: DC | PRN

## 2015-07-20 MED ORDER — WITCH HAZEL-GLYCERIN EX PADS
1.0000 "application " | MEDICATED_PAD | CUTANEOUS | Status: DC | PRN
  Administered 2015-07-20: 1 via TOPICAL

## 2015-07-20 MED ORDER — DIPHENHYDRAMINE HCL 25 MG PO CAPS: 25.0000 mg | ORAL_CAPSULE | Freq: Four times a day (QID) | ORAL | Status: DC | PRN

## 2015-07-20 MED ORDER — METHYLERGONOVINE MALEATE 0.2 MG/ML IJ SOLN: 0.2000 mg | INTRAMUSCULAR | Status: DC | PRN

## 2015-07-20 MED ORDER — KETOTIFEN FUMARATE 0.025 % OP SOLN
1.0000 [drp] | Freq: Two times a day (BID) | OPHTHALMIC | Status: DC | PRN
  Filled 2015-07-20: qty 5

## 2015-07-20 MED ORDER — TETANUS-DIPHTH-ACELL PERTUSSIS 5-2.5-18.5 LF-MCG/0.5 IM SUSP: 0.5000 mL | Freq: Once | INTRAMUSCULAR | Status: DC

## 2015-07-20 MED ORDER — SODIUM CHLORIDE 0.9 % IV SOLN: 250.0000 mL | INTRAVENOUS | Status: DC | PRN

## 2015-07-20 MED ORDER — TERBUTALINE SULFATE 1 MG/ML IJ SOLN
0.2500 mg | Freq: Once | INTRAMUSCULAR | Status: DC | PRN
  Filled 2015-07-20: qty 1

## 2015-07-20 MED ORDER — LACTATED RINGERS IV SOLN: 500.0000 mL | INTRAVENOUS | Status: DC | PRN

## 2015-07-20 MED ORDER — ACETAMINOPHEN 325 MG PO TABS: 650.0000 mg | ORAL_TABLET | ORAL | Status: DC | PRN

## 2015-07-20 MED ORDER — FERROUS SULFATE 325 (65 FE) MG PO TABS
325.0000 mg | ORAL_TABLET | Freq: Two times a day (BID) | ORAL | Status: DC
  Administered 2015-07-21 – 2015-07-22 (×3): 325 mg via ORAL
  Filled 2015-07-20 (×3): qty 1

## 2015-07-20 MED ORDER — OXYCODONE-ACETAMINOPHEN 5-325 MG PO TABS
1.0000 | ORAL_TABLET | ORAL | Status: DC | PRN
  Administered 2015-07-20 – 2015-07-21 (×3): 1 via ORAL
  Filled 2015-07-20 (×3): qty 1

## 2015-07-20 MED ORDER — EPHEDRINE 5 MG/ML INJ
10.0000 mg | INTRAVENOUS | Status: DC | PRN
  Filled 2015-07-20: qty 2

## 2015-07-20 MED ORDER — IBUPROFEN 800 MG PO TABS
800.0000 mg | ORAL_TABLET | Freq: Three times a day (TID) | ORAL | Status: DC
Start: 2015-07-20 — End: 2015-07-22
  Administered 2015-07-20 – 2015-07-22 (×6): 800 mg via ORAL
  Filled 2015-07-20 (×6): qty 1

## 2015-07-20 MED ORDER — PRENATAL MULTIVITAMIN CH
1.0000 | ORAL_TABLET | Freq: Every day | ORAL | Status: DC
  Administered 2015-07-21: 1 via ORAL
  Filled 2015-07-20: qty 1

## 2015-07-20 MED ORDER — INFLUENZA VAC SPLIT QUAD 0.5 ML IM SUSY
0.5000 mL | PREFILLED_SYRINGE | INTRAMUSCULAR | Status: AC
  Administered 2015-07-21: 0.5 mL via INTRAMUSCULAR
  Filled 2015-07-20: qty 0.5

## 2015-07-20 MED ORDER — MAGNESIUM HYDROXIDE 400 MG/5ML PO SUSP: 30.0000 mL | ORAL | Status: DC | PRN

## 2015-07-20 NOTE — Anesthesia Procedure Notes (Signed)
Epidural Patient location during procedure: OB  Staffing Anesthesiologist: Catalina Gravel Performed by: anesthesiologist   Preanesthetic Checklist Completed: patient identified, pre-op evaluation, timeout performed, IV checked, risks and benefits discussed and monitors and equipment checked  Epidural Patient position: sitting Prep: DuraPrep Patient monitoring: blood pressure and continuous pulse ox Approach: midline Location: L3-L4 Injection technique: LOR air  Needle:  Needle type: Tuohy  Needle gauge: 17 G Needle length: 9 cm Needle insertion depth: 6 cm Catheter size: 19 Gauge Catheter at skin depth: 10 cm Test dose: negative and Other (1% Lidocaine)  Additional Notes Patient identified.  Risk benefits discussed including failed block, incomplete pain control, headache, nerve damage, paralysis, blood pressure changes, nausea, vomiting, reactions to medication both toxic or allergic, and postpartum back pain.  Patient expressed understanding and wished to proceed.  All questions were answered.  Sterile technique used throughout procedure and epidural site dressed with sterile barrier dressing. No paresthesia or other complications noted. The patient did not experience any signs of intravascular injection such as tinnitus or metallic taste in mouth nor signs of intrathecal spread such as rapid motor block. Please see nursing notes for vital signs. Reason for block:procedure for pain

## 2015-07-20 NOTE — H&P (Signed)
39 y.o. [redacted]w[redacted]d  [id number] comes in for induction with advanced dilation.  Otherwise has good fetal movement and no bleeding.  Past Medical History  Diagnosis Date  . Fibroid   . Pregnancy induced hypertension     with first pregnancy  . Gestational diabetes     with second and third child  . Hx of varicella     Past Surgical History  Procedure Laterality Date  . Dilation and evacuation    . Dilation and curettage of uterus      OB History  Gravida Para Term Preterm AB SAB TAB Ectopic Multiple Living  6 3 3  0 2 1 0 0 0 3    # Outcome Date GA Lbr Len/2nd Weight Sex Delivery Anes PTL Lv  6 Current           5 Term 04/20/13 [redacted]w[redacted]d 03:03 / 01:26 3.555 kg (7 lb 13.4 oz) M Vag-Spont EPI  Y     Comments: wnl  4 AB 01/19/12             Comments: blighted ovum D&E  3 Term 06/25/11 [redacted]w[redacted]d  2.805 kg (6 lb 2.9 oz) F Vag-Spont Local  Y  2 Term 04/2011 [redacted]w[redacted]d  3.799 kg (8 lb 6 oz)  Vag-Spont EPI N Y  1 SAB 2009              Social History   Social History  . Marital Status: Married    Spouse Name: N/A  . Number of Children: N/A  . Years of Education: N/A   Occupational History  . Not on file.   Social History Main Topics  . Smoking status: Never Smoker   . Smokeless tobacco: Never Used  . Alcohol Use: No  . Drug Use: No  . Sexual Activity: Yes   Other Topics Concern  . Not on file   Social History Narrative   Review of patient's allergies indicates no known allergies.    Prenatal Transfer Tool  Maternal Diabetes: No Genetic Screening: Normal Maternal Ultrasounds/Referrals: Normal Fetal Ultrasounds or other Referrals:  None Maternal Substance Abuse:  No Significant Maternal Medications:  None Significant Maternal Lab Results: None  Other PNC: uncomplicated.    There were no vitals filed for this visit.   Lungs/Cor:  NAD Abdomen:  soft, gravid Ex:  no cords, erythema SVE:  5/70/-2, AROM clear FHTs:  140, good STV, NST R Toco:  q 5   A/P   Term induction with  advance dilation.  GBS neg.  Catherine Maldonado A

## 2015-07-20 NOTE — Anesthesia Preprocedure Evaluation (Addendum)
Anesthesia Evaluation  Patient identified by MRN, date of birth, ID band Patient awake    Reviewed: Allergy & Precautions, NPO status , Patient's Chart, lab work & pertinent test results  History of Anesthesia Complications Negative for: history of anesthetic complications  Airway Mallampati: III  TM Distance: >3 FB Neck ROM: Full    Dental  (+) Teeth Intact, Dental Advisory Given   Pulmonary neg pulmonary ROS,  breath sounds clear to auscultation  Pulmonary exam normal       Cardiovascular negative cardio ROS Normal cardiovascular examRhythm:Regular Rate:Normal     Neuro/Psych negative neurological ROS     GI/Hepatic negative GI ROS, Neg liver ROS,   Endo/Other  Obesity  Renal/GU negative Renal ROS     Musculoskeletal negative musculoskeletal ROS (+)   Abdominal   Peds  Hematology negative hematology ROS (+)   Anesthesia Other Findings Day of surgery medications reviewed with the patient.  Reproductive/Obstetrics                           Anesthesia Physical Anesthesia Plan  ASA: II  Anesthesia Plan: Epidural   Post-op Pain Management:    Induction:   Airway Management Planned:   Additional Equipment:   Intra-op Plan:   Post-operative Plan:   Informed Consent: I have reviewed the patients History and Physical, chart, labs and discussed the procedure including the risks, benefits and alternatives for the proposed anesthesia with the patient or authorized representative who has indicated his/her understanding and acceptance.   Dental advisory given  Plan Discussed with:   Anesthesia Plan Comments: (Patient identified. Risks/Benefits/Options discussed with patient including but not limited to bleeding, infection, nerve damage, paralysis, failed block, incomplete pain control, headache, blood pressure changes, nausea, vomiting, reactions to medication both or allergic, itching  and postpartum back pain. Confirmed with bedside nurse the patient's most recent platelet count. Confirmed with patient that they are not currently taking any anticoagulation, have any bleeding history or any family history of bleeding disorders. Patient expressed understanding and wished to proceed. All questions were answered. )        Anesthesia Quick Evaluation

## 2015-07-21 LAB — CBC
HCT: 35.1 % — ABNORMAL LOW (ref 36.0–46.0)
HEMOGLOBIN: 11.5 g/dL — AB (ref 12.0–15.0)
MCH: 26 pg (ref 26.0–34.0)
MCHC: 32.8 g/dL (ref 30.0–36.0)
MCV: 79.2 fL (ref 78.0–100.0)
PLATELETS: 179 10*3/uL (ref 150–400)
RBC: 4.43 MIL/uL (ref 3.87–5.11)
RDW: 16.9 % — ABNORMAL HIGH (ref 11.5–15.5)
WBC: 10.2 10*3/uL (ref 4.0–10.5)

## 2015-07-21 LAB — RPR: RPR: NONREACTIVE

## 2015-07-21 NOTE — Anesthesia Postprocedure Evaluation (Signed)
  Anesthesia Post-op Note  Patient: Catherine Maldonado  Procedure(s) Performed: * No procedures listed *  Patient Location: Mother/Baby  Anesthesia Type:Epidural  Level of Consciousness: awake, alert , oriented and patient cooperative  Airway and Oxygen Therapy: Patient Spontanous Breathing  Post-op Pain: mild  Post-op Assessment: Patient's Cardiovascular Status Stable, Respiratory Function Stable, No headache, No backache and Patient able to bend at knees              Post-op Vital Signs: Reviewed and stable  Last Vitals:  Filed Vitals:   07/21/15 0537  BP: 137/74  Pulse:   Temp:   Resp:     Complications: No apparent anesthesia complications

## 2015-07-21 NOTE — Discharge Instructions (Signed)

## 2015-07-21 NOTE — Lactation Note (Deleted)
This note was copied from the chart of [place]. Lactation Consultation Note  Patient Name: [name] Date: 07/21/2015  Lactation visit. Report given that mother has sore nipples and unable to latch baby to breast. She reports having cracked nipples that were too painful to latch baby and she switched to formula feeding after the birth of her first baby. She reports that her milk came in and she experienced engorgement. Currently, mother is post tubal ligation from yesterday, has right shoulder pain due to a "pinched nerve", taking pain medication for both, using a heating pad and has pain in her arm when lifting the baby. Her left nipple has a crack and positional stripe that has been bleeding. She reports it is too painful to latch the baby. Her right nipple is red and tender. She is wearing comfort gels and using expressed milk to nipples.  At this time, mother is not latching and prefers to formula feed. She is considering using a double electric pump when she has use of her arm and feeling better. Mother to request a pump to be set up by staff. Discussed pumping and expressing milk to give per bottle as an option.Patient has [place]. If mother is pumping at discharge, she can obtain a [facility] loaner pump at discharge. Discussed loaner program. Lactation will follow tomorrow.    Maternal Data    Feeding    LATCH Score/Interventions                      Lactation Tools Discussed/Used     Consult Status      Catherine Maldonado 07/21/2015, 3:13 PM

## 2015-07-21 NOTE — Lactation Note (Signed)
This note was copied from the chart of [facility]. Lactation Consultation Note  Patient Name: Catherine Maldonado JOITG'P Date: 07/21/2015 Reason for consult: Initial assessment Initial visit to provide breastfeeding support. Mother is breastfeeding and formula per choice. She reports this is how she fed her last child for 6 months, mostly breastfeeding and giving formula as needed. Observed mother latch baby to breast using a cradle hold. Mother has soft breast and semi-large nipples. Baby opened mouth wide and latched well after a few attempts due to crying. Mother was shown hand expression and patient return the demonstration. Colostrum was easily expressed. Discussed benefits of exclusively breastfeeding, potential for nipple confusion when using rubber teat and pacifier before baby has established latching with ease and milk production is in. Mother verbalized understanding and demonstrated independence with breastfeeding. Instructed to request LC if needed. Mom made aware of O/P services, breastfeeding support groups, community resources, and our phone # for post-discharge questions. Lactation brochure was left with mother.  Maternal Data    Feeding Feeding Type: Breast Fed  LATCH Score/Interventions Latch: Grasps breast easily, tongue down, lips flanged, rhythmical sucking.  Audible Swallowing: A few with stimulation Intervention(s): Skin to skin Intervention(s): Hand expression  Type of Nipple: Everted at rest and after stimulation (large nipples)  Comfort (Breast/Nipple): Soft / non-tender     Hold (Positioning): No assistance needed to correctly position infant at breast. Intervention(s): Breastfeeding basics reviewed  LATCH Score: 9  Lactation Tools Discussed/Used     Consult Status Consult Status: PRN    Gwenevere Abbot 07/21/2015, 4:07 PM

## 2015-07-21 NOTE — Discharge Summary (Signed)
Obstetric Discharge Summary Reason for Admission: induction of labor Prenatal Procedures: none Intrapartum Procedures: spontaneous vaginal delivery Postpartum Procedures: none Complications-Operative and Postpartum: 1 degree perineal laceration HEMOGLOBIN  Date Value Ref Range Status  07/21/2015 11.5* 12.0 - 15.0 g/dL Final   HCT  Date Value Ref Range Status  07/21/2015 35.1* 36.0 - 46.0 % Final    Physical Exam:  General: alert, cooperative and appears stated age 39: appropriate Uterine Fundus: firm DVT Evaluation: No evidence of DVT seen on physical exam.  Discharge Diagnoses: Term Pregnancy-delivered  Discharge Information: Date: 07/21/2015 Activity: pelvic rest Diet: routine Medications: PNV and Ibuprofen Condition: stable Instructions: refer to practice specific booklet Discharge to: home Follow-up Information    Follow up with HORVATH,MICHELLE A, MD In 4 weeks.   Specialty:  Obstetrics and Gynecology   Contact information:   [address] [place] [address] [phone]       Newborn Data: Live born female  Birth Weight: 7 lb 6 oz (3345 g) APGAR: 9, 9  Home with mother.  [place] 07/21/2015, 8:51 AM

## 2015-07-21 NOTE — Progress Notes (Signed)
Patient is doing well.  She is ambulating, voiding, tolerating PO.  Pain control is good.  Lochia is appropriate  Filed Vitals:   07/20/15 1831 07/20/15 2257 07/21/15 0412 07/21/15 0537  BP: 131/65 131/73 145/86 137/74  Pulse: 80 79 78   Temp: 97.7 F (36.5 C) 97.9 F (36.6 C) 98.1 F (36.7 C)   TempSrc: Oral Oral    Resp: 16 16    Height:      Weight:      SpO2:        NAD Fundus firm Ext: trace edema  Lab Results  Component Value Date   WBC 10.2 07/21/2015   HGB 11.5* 07/21/2015   HCT 35.1* 07/21/2015   MCV 79.2 07/21/2015   PLT 179 07/21/2015    --/--/B POS (09/01 0915)/RImmune  A/P 39 y.o. [id number] PPD#1 s/p TSVD. Routine care.   Expect d/c tomorrow.  Desires circumcisison. Discussed r/b/a of the procedure. Reviewed that circumcision is an elective surgical procedure and not considered medically necessary. Reviewed the risks of the procedure including the risk of infection, bleeding, damage to surrounding structures, including scrotum, shaft, urethra and head of penis, and an undesired cosmetic effect requiring additional procedures for revision. Consent signed.      [place]

## 2015-07-22 MED ORDER — IBUPROFEN 800 MG PO TABS: 800.0000 mg | ORAL_TABLET | Freq: Four times a day (QID) | ORAL | Status: AC | PRN

## 2015-07-22 NOTE — Progress Notes (Signed)
Patient is doing well.  She is ambulating, voiding, tolerating PO.  Pain control is good.  Lochia is appropriate  Filed Vitals:   07/20/15 2257 07/21/15 0412 07/21/15 0537 07/21/15 1823  BP: 131/73 145/86 137/74 118/54  Pulse: 79 78  92  Temp: 97.9 F (36.6 C) 98.1 F (36.7 C)  98.2 F (36.8 C)  TempSrc: Oral   Oral  Resp: 16   16  Height:      Weight:      SpO2:        NAD Fundus firm Ext: trace edema  Lab Results  Component Value Date   WBC 10.2 07/21/2015   HGB 11.5* 07/21/2015   HCT 35.1* 07/21/2015   MCV 79.2 07/21/2015   PLT 179 07/21/2015    --/--/B POS (09/01 0915)/RImmune  A/P 39 y.o. [id number] PPD#2 s/p TSVD. Routine care.   Meeting all goals.  D/c today.   [place]

## 2018-08-19 ENCOUNTER — Other Ambulatory Visit: Payer: Self-pay | Admitting: Obstetrics and Gynecology

## 2018-08-19 DIAGNOSIS — R928 Other abnormal and inconclusive findings on diagnostic imaging of breast: Secondary | ICD-10-CM

## 2018-08-25 ENCOUNTER — Other Ambulatory Visit: Payer: Self-pay | Admitting: Obstetrics and Gynecology

## 2018-08-25 ENCOUNTER — Ambulatory Visit
Admission: RE | Admit: 2018-08-25 | Discharge: 2018-08-25 | Disposition: A | Discharge status: Home / Self Care | Payer: BLUE CROSS/BLUE SHIELD | Source: Ambulatory Visit | Attending: Obstetrics and Gynecology | Admitting: Obstetrics and Gynecology

## 2018-08-25 DIAGNOSIS — N63 Unspecified lump in unspecified breast: Secondary | ICD-10-CM

## 2018-08-25 DIAGNOSIS — R928 Other abnormal and inconclusive findings on diagnostic imaging of breast: Secondary | ICD-10-CM

## 2019-02-25 ENCOUNTER — Ambulatory Visit
Admission: RE | Admit: 2019-02-25 | Discharge: 2019-02-25 | Disposition: A | Discharge status: Home / Self Care | Payer: BLUE CROSS/BLUE SHIELD | Source: Ambulatory Visit | Attending: Obstetrics and Gynecology | Admitting: Obstetrics and Gynecology

## 2019-02-25 ENCOUNTER — Other Ambulatory Visit: Payer: Self-pay | Admitting: Obstetrics and Gynecology

## 2019-02-25 ENCOUNTER — Other Ambulatory Visit: Payer: Self-pay

## 2019-02-25 DIAGNOSIS — N63 Unspecified lump in unspecified breast: Secondary | ICD-10-CM

## 2019-08-30 ENCOUNTER — Ambulatory Visit
Admission: RE | Admit: 2019-08-30 | Discharge: 2019-08-30 | Disposition: A | Discharge status: Home / Self Care | Payer: BLUE CROSS/BLUE SHIELD | Source: Ambulatory Visit | Attending: Obstetrics and Gynecology | Admitting: Obstetrics and Gynecology

## 2019-08-30 ENCOUNTER — Ambulatory Visit
Admission: RE | Admit: 2019-08-30 | Discharge: 2019-08-30 | Disposition: A | Discharge status: Home / Self Care | Payer: BC Managed Care – PPO | Source: Ambulatory Visit | Attending: Obstetrics and Gynecology | Admitting: Obstetrics and Gynecology

## 2019-08-30 ENCOUNTER — Other Ambulatory Visit: Payer: Self-pay

## 2019-08-30 DIAGNOSIS — N63 Unspecified lump in unspecified breast: Secondary | ICD-10-CM

## 2020-01-14 IMAGING — US US BREAST*L* LIMITED INC AXILLA
1 series · 4 of 4 positions shown · non-contrast
Comparison: 02/25/2019 and earlier

CLINICAL DATA: One year follow-up for probably benign mass in the 5
o'clock location of the LEFT breast.

EXAM:
DIGITAL DIAGNOSTIC BILATERAL MAMMOGRAM WITH CAD AND TOMO
ULTRASOUND LEFT BREAST

[Series 1: us breast*left* limited inc axilla · 0.06mm/px · 4 of 4 slices shown]
[im 1/4]
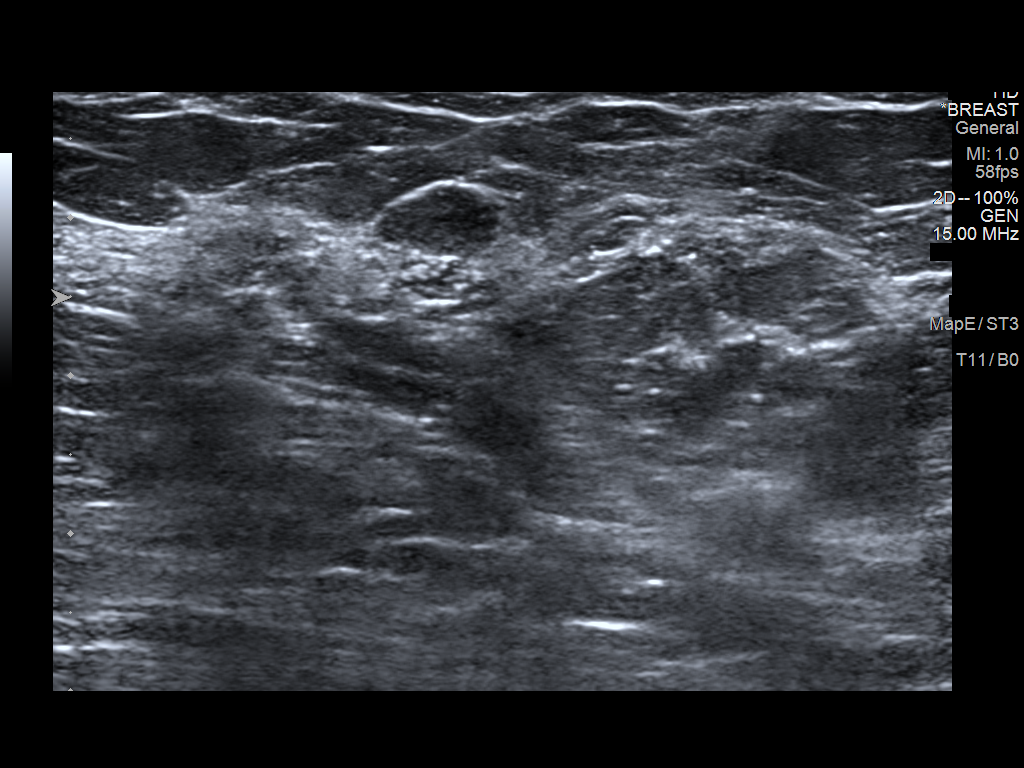
[im 2/4]
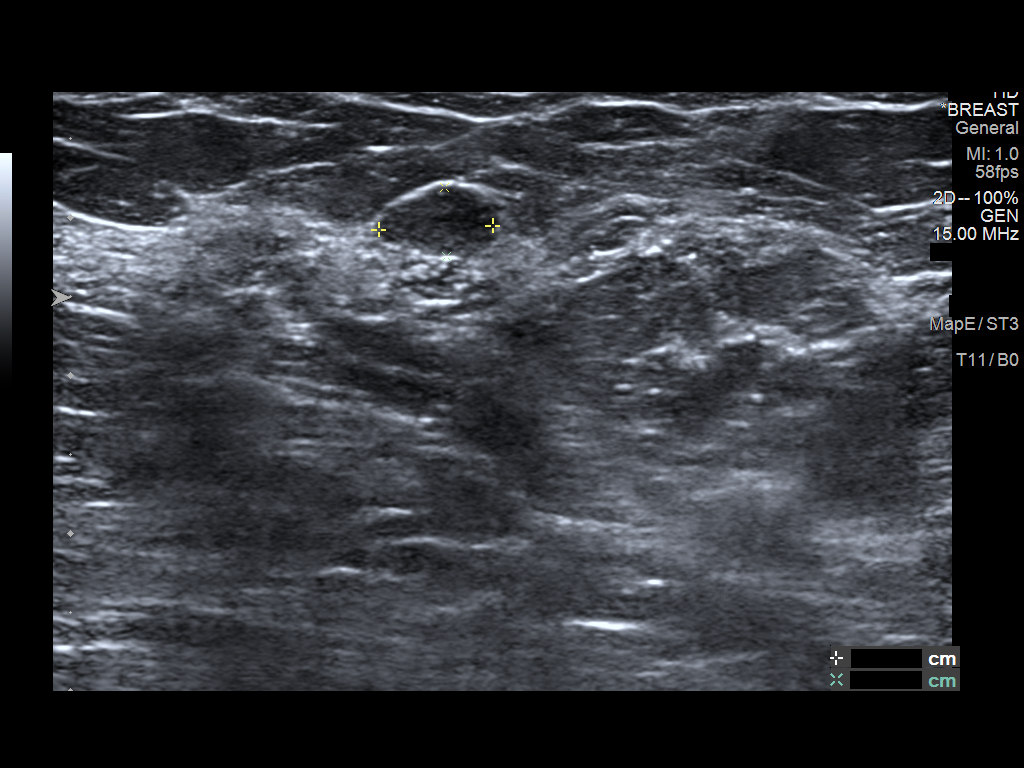
[im 3/4]
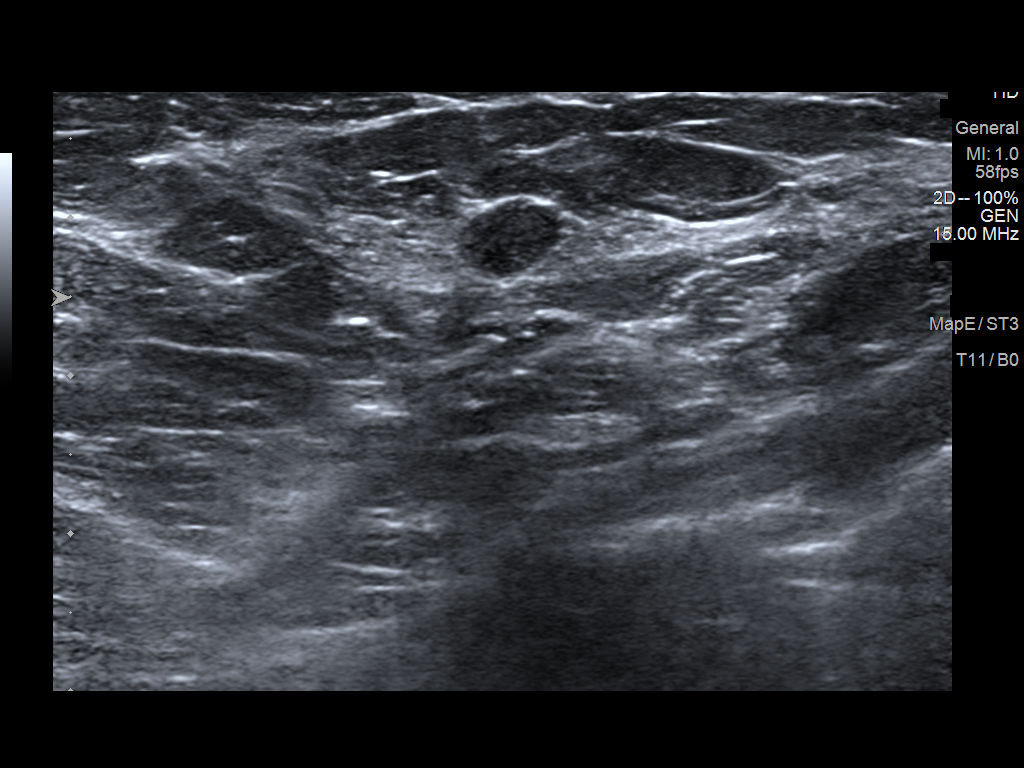
[im 4/4]
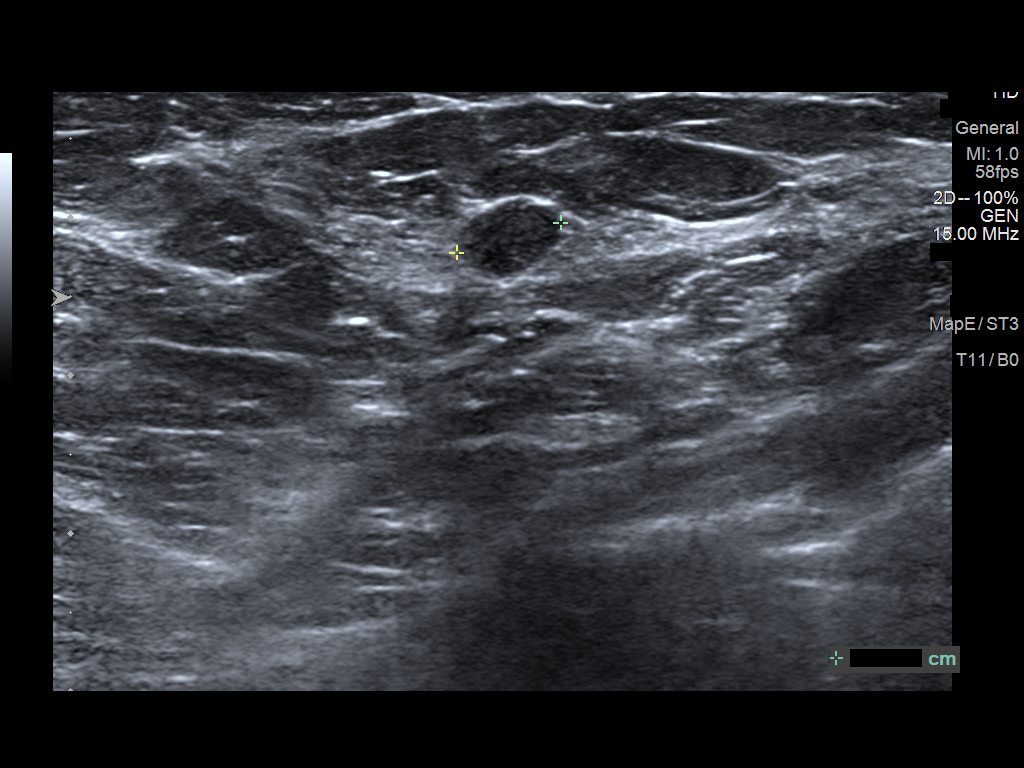

[4 of 4 positions shown; findings below may reference images not displayed]

ACR Breast Density Category c: The breast tissue is heterogeneously
dense, which may obscure small masses.
FINDINGS: Stable appearance of circumscribed oval mass in the LOWER OUTER
QUADRANT of the LEFT breast. The RIGHT breast is negative.

Mammographic images were processed with CAD.

Targeted ultrasound is performed, showing a circumscribed oval
parallel hypoechoic mass in the 5 o'clock location of the LEFT
breast 3 centimeters from the nipple. Mass measures 0.7 x 0.4 x
centimeters, previously 0.8 x 0.4 x 0.6 centimeters. No new or
suspicious findings identified with ultrasound.
IMPRESSION: Stable appearance of probable fibroadenoma in the LEFT. No
mammographic or ultrasound evidence for malignancy.

RECOMMENDATION:
Bilateral diagnostic mammogram and LEFT ultrasound recommended in 1
year.

I have discussed the findings and recommendations with the patient.
If applicable, a reminder letter will be sent to the patient
regarding the next appointment.

BI-RADS CATEGORY  3: Probably benign.

## 2020-01-14 IMAGING — MG MM DIGITAL DIAGNOSTIC BILAT W/ TOMO W/ CAD
8 series · 8 of 24 positions shown · non-contrast
Comparison: 02/25/2019 and earlier

CLINICAL DATA: One year follow-up for probably benign mass in the 5
o'clock location of the LEFT breast.

EXAM:
DIGITAL DIAGNOSTIC BILATERAL MAMMOGRAM WITH CAD AND TOMO
ULTRASOUND LEFT BREAST

[R CC synth-2D]
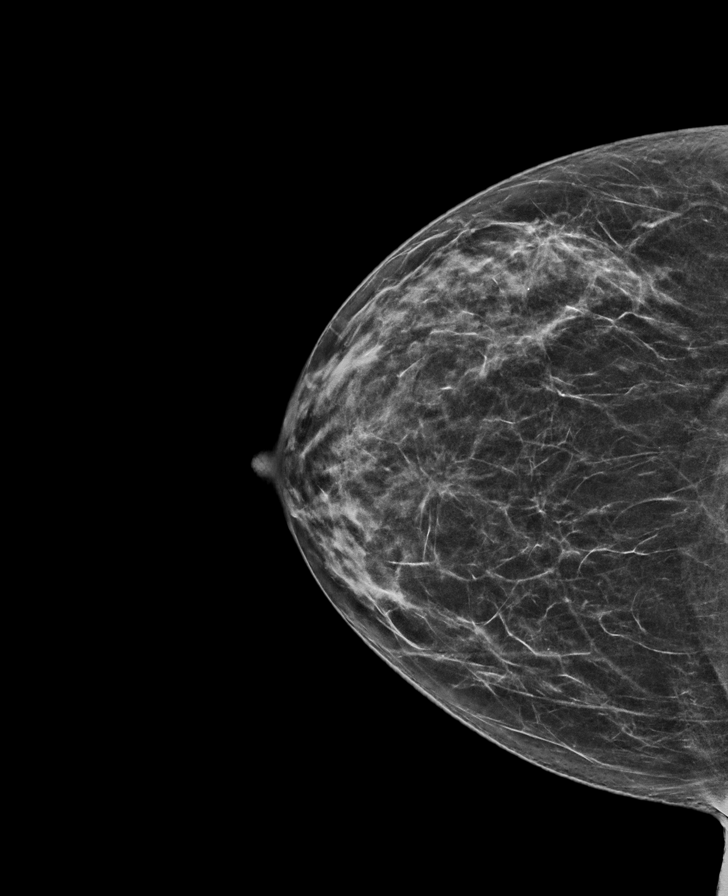

[R MLO synth-2D]
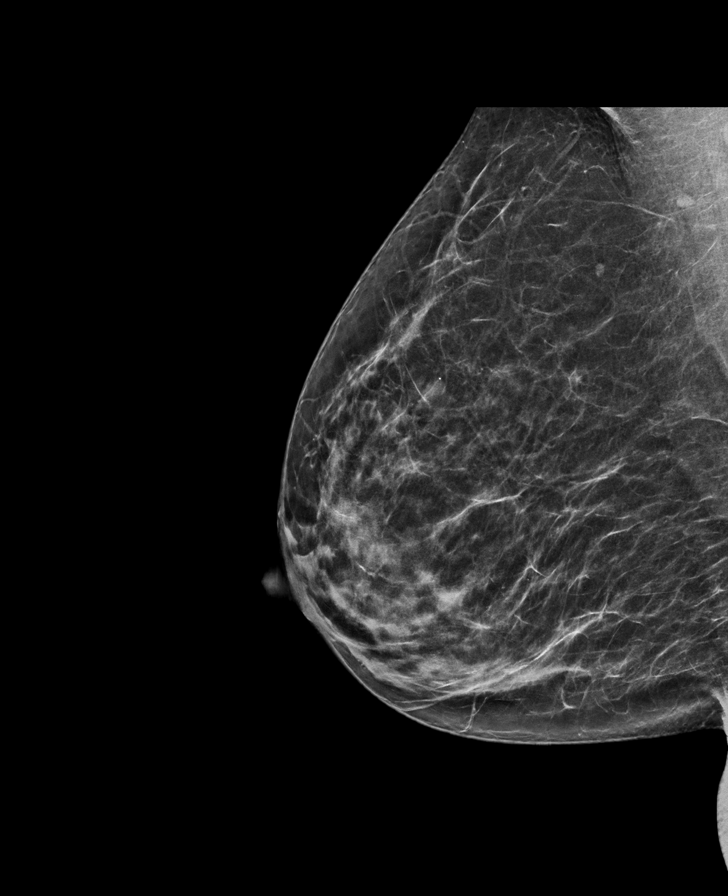

[L CC synth-2D]
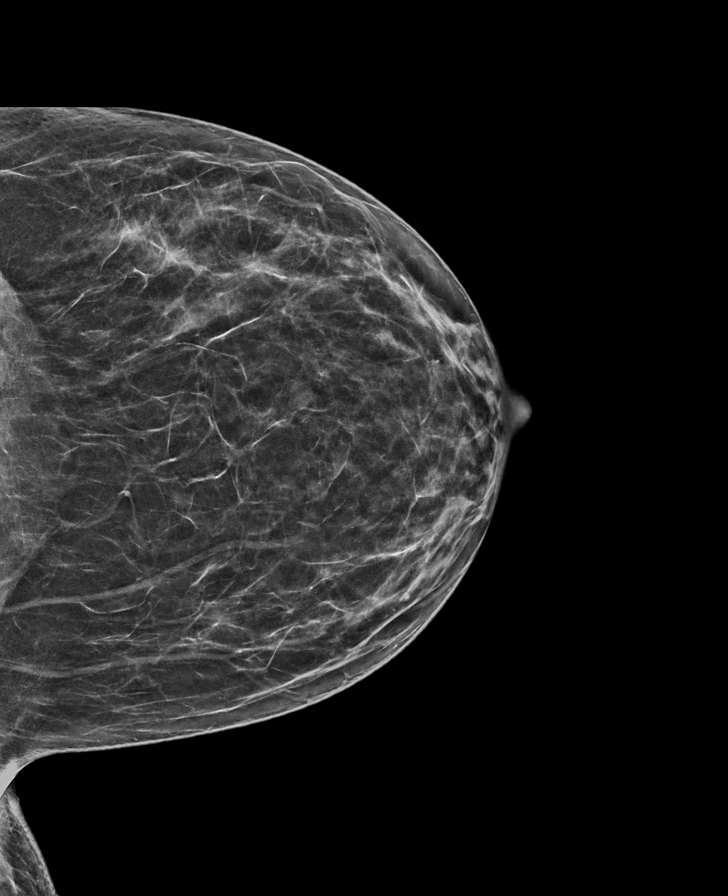

[L MLO synth-2D]
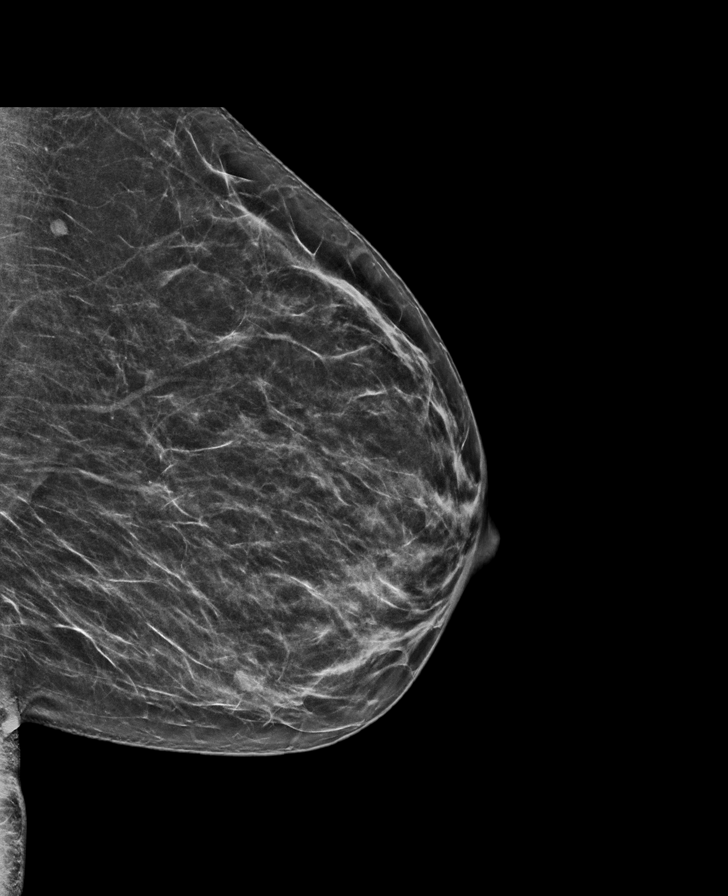

[R CC tomo · tomo slice 32/63.0]
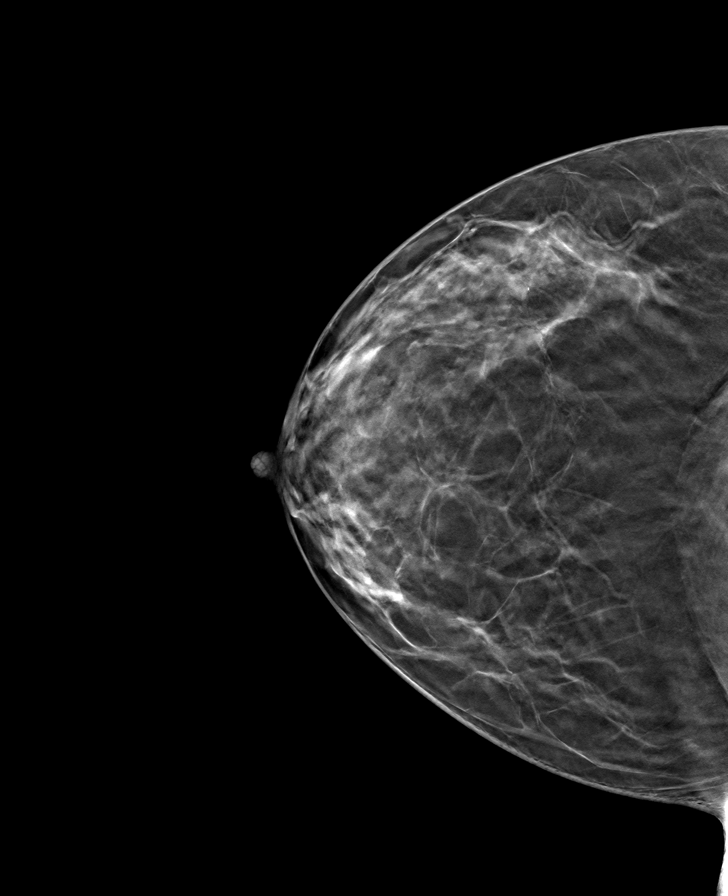

[L CC tomo · tomo slice 33/64.0]
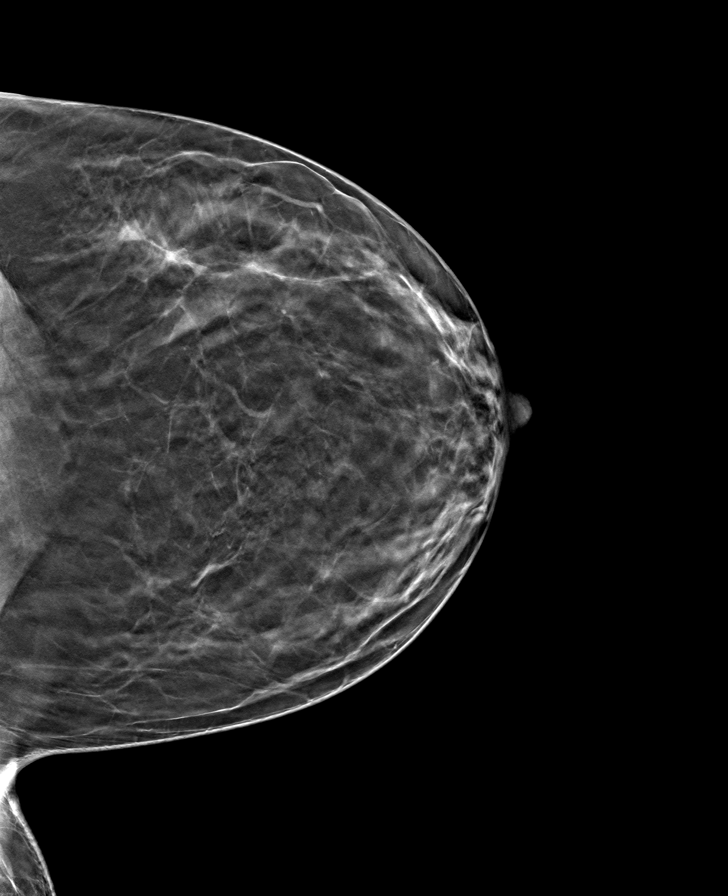

[L MLO tomo · tomo slice 31/62.0]
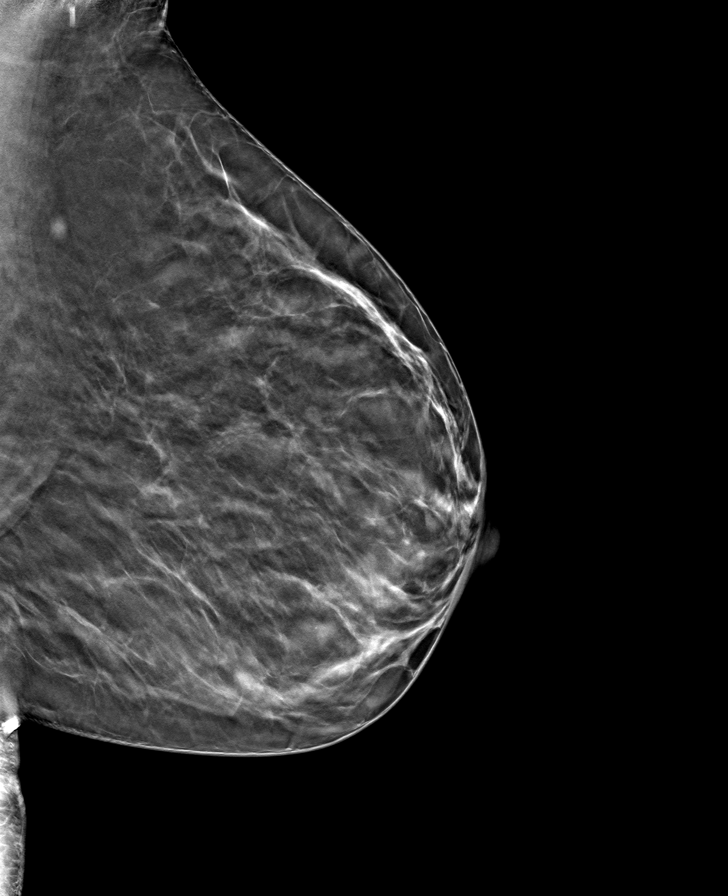

[R MLO tomo · tomo slice 33/65.0]
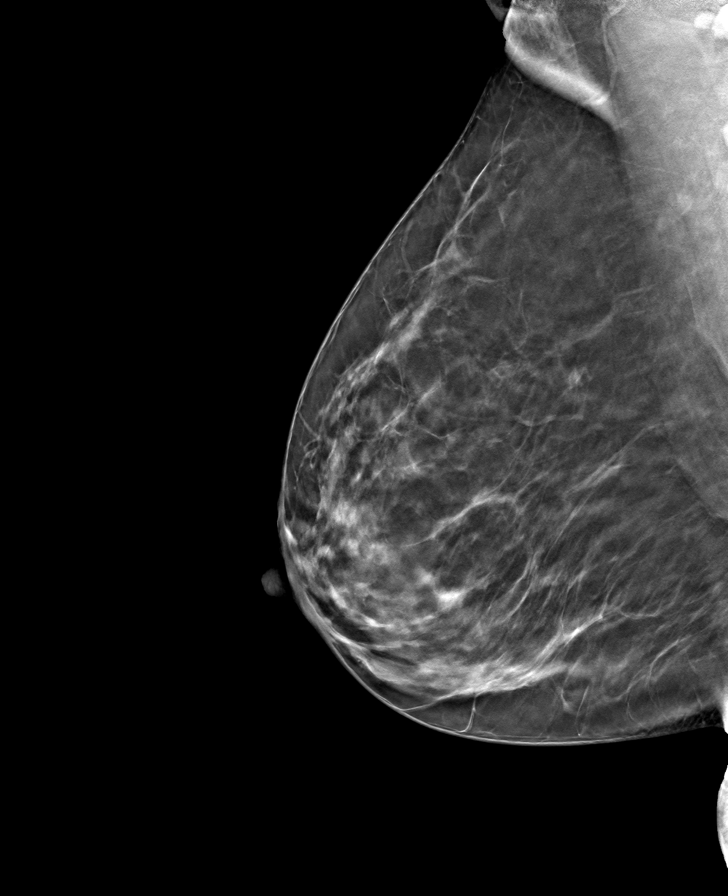

[8 of 24 positions shown; findings below may reference images not displayed]

ACR Breast Density Category c: The breast tissue is heterogeneously
dense, which may obscure small masses.
FINDINGS: Stable appearance of circumscribed oval mass in the LOWER OUTER
QUADRANT of the LEFT breast. The RIGHT breast is negative.

Mammographic images were processed with CAD.

Targeted ultrasound is performed, showing a circumscribed oval
parallel hypoechoic mass in the 5 o'clock location of the LEFT
breast 3 centimeters from the nipple. Mass measures 0.7 x 0.4 x
centimeters, previously 0.8 x 0.4 x 0.6 centimeters. No new or
suspicious findings identified with ultrasound.
IMPRESSION: Stable appearance of probable fibroadenoma in the LEFT. No
mammographic or ultrasound evidence for malignancy.

RECOMMENDATION:
Bilateral diagnostic mammogram and LEFT ultrasound recommended in 1
year.

I have discussed the findings and recommendations with the patient.
If applicable, a reminder letter will be sent to the patient
regarding the next appointment.

BI-RADS CATEGORY  3: Probably benign.
